# Patient Record
Sex: Male | Born: 1975 | Hispanic: No | Marital: Married | Smoking: Never smoker
Health system: Southern US, Community
[De-identification: ages and names within clinical notes are randomized; demographics above are authoritative.]

---

## 2018-04-02 ENCOUNTER — Encounter (HOSPITAL_COMMUNITY): Payer: Self-pay | Admitting: Emergency Medicine

## 2018-04-02 ENCOUNTER — Other Ambulatory Visit: Payer: Self-pay

## 2018-04-02 ENCOUNTER — Ambulatory Visit (INDEPENDENT_AMBULATORY_CARE_PROVIDER_SITE_OTHER): Payer: Self-pay

## 2018-04-02 ENCOUNTER — Ambulatory Visit (HOSPITAL_COMMUNITY)
Admission: EM | Admit: 2018-04-02 | Discharge: 2018-04-02 | Disposition: A | Discharge status: Home / Self Care | Payer: Self-pay | Attending: Family Medicine | Admitting: Family Medicine

## 2018-04-02 DIAGNOSIS — R0781 Pleurodynia: Secondary | ICD-10-CM

## 2018-04-02 MED ORDER — ALBUTEROL SULFATE HFA 108 (90 BASE) MCG/ACT IN AERS
2.0000 | INHALATION_SPRAY | Freq: Once | RESPIRATORY_TRACT | Status: AC
  Administered 2018-04-02: 2 via RESPIRATORY_TRACT

## 2018-04-02 MED ORDER — ALBUTEROL SULFATE HFA 108 (90 BASE) MCG/ACT IN AERS
INHALATION_SPRAY | RESPIRATORY_TRACT | Status: AC
  Filled 2018-04-02: qty 6.7

## 2018-04-02 MED ORDER — PREDNISONE 20 MG PO TABS: 40.0000 mg | ORAL_TABLET | Freq: Every day | ORAL | 0 refills | Status: AC

## 2018-04-02 NOTE — ED Provider Notes (Signed)
[facility]    CSN: [id number] Arrival date & time: 04/02/18  1207     History   Chief Complaint Chief Complaint  Patient presents with  . Pleurisy    HPI Austin Ferguson is a 42 y.o. male.   Austin Ferguson presents with complaints of chest tightness, worse with deep breathing, which has been experiencing for the past week. He states if he takes multiple deep breaths he feels lightheaded. Denies shortness of breath. Denies any recent travel, any previous similar, fever, cough, leg pain or swelling, pain at rest. No cardiac history, no family cardiac history. States had asthma when he was a teenager but has not had to use an inhaler for approximately 20 years. He works in [employer], states he has been working around dust. Does not smoke.    ROS per HPI.      History reviewed. No pertinent past medical history.  There are no active problems to display for this patient.   History reviewed. No pertinent surgical history.     Home Medications    Prior to Admission medications   Medication Sig Start Date End Date Taking? Authorizing Provider  predniSONE (DELTASONE) 20 MG tablet Take 2 tablets (40 mg total) by mouth daily with breakfast for 5 days. 04/02/18 04/07/18  Georgetta Haber, NP    Family History History reviewed. No pertinent family history.  Social History Social History   Tobacco Use  . Smoking status: Never Smoker  . Smokeless tobacco: Never Used  Substance Use Topics  . Alcohol use: Yes  . Drug use: Never     Allergies   Patient has no known allergies.   Review of Systems Review of Systems   Physical Exam Triage Vital Signs ED Triage Vitals [04/02/18 1310]  Enc Vitals Group     BP 130/73     Pulse Rate 65     Resp 18     Temp 97.6 F (36.4 C)     Temp Source Oral     SpO2 99 %     Weight      Height      Head Circumference      Peak Flow      Pain Score 0     Pain Loc      Pain Edu?      Excl. in GC?    No data  found.  Updated Vital Signs BP 130/73 (BP Location: Left Arm)   Pulse 65   Temp 97.6 F (36.4 C) (Oral)   Resp 18   SpO2 99%    Physical Exam  Constitutional: He is oriented to person, place, and time. He appears well-developed and well-nourished.  HENT:  Head: Normocephalic.  Eyes: Pupils are equal, round, and reactive to light. EOM are normal.  Cardiovascular: Normal rate and regular rhythm.  Pulmonary/Chest: Effort normal and breath sounds normal. No respiratory distress.  Neurological: He is alert and oriented to person, place, and time.  Skin: Skin is warm and dry.   EKG NSR rate 68, without acute findings.   UC Treatments / Results  Labs (all labs ordered are listed, but only abnormal results are displayed) Labs Reviewed - No data to display  EKG None  Radiology Dg Chest 2 View  Result Date: 04/02/2018 CLINICAL DATA:  Chest tightness EXAM: CHEST - 2 VIEW COMPARISON:  None. FINDINGS: Normal heart size. Normal mediastinal contour. No pneumothorax. No pleural effusion. Lungs appear clear, with no acute consolidative airspace disease and no pulmonary  edema. IMPRESSION: No active cardiopulmonary disease. Electronically Signed   By: Delbert Phenix M.D.   On: 04/02/2018 13:37    Procedures Procedures (including critical care time)  Medications Ordered in UC Medications  albuterol (PROVENTIL HFA;VENTOLIN HFA) 108 (90 Base) MCG/ACT inhaler 2 puff (has no administration in time range)    Initial Impression / Assessment and Plan / UC Course  I have reviewed the triage vital signs and the nursing notes.  Pertinent labs & imaging results that were available during my care of the patient were reviewed by me and considered in my medical decision making (see chart for details).     Afebrile. Non toxic in appearance. Without increased work of breathing, tachycardia, tachypnea, hypoxia. ekg and chest xray reassuring today. Dust exposure at work in [employer], hx of asthma in  childhood. Will treat with prednisone and inhaler at this time. Return precautions provided. Encouraged follow up for recheck with PCP in the next 1-2 weeks especially if symptoms persist. Patient verbalized understanding and agreeable to plan.     Final Clinical Impressions(s) / UC Diagnoses   Final diagnoses:  Pleuritic chest pain     Discharge Instructions     Your ekg and chest xray are normal today which is reassuring. We will try to treat this as an inflammatory process with 5 days of prednisone and use of an inhaler every 4-6 hours as needed for tightness, wheezing or shortness of breath. If you develop increased pain, fever, shortness of breath, chest pain, nausea, sweating, jaw or arm pain, or otherwise worsening please return or go to Er .    ED Prescriptions    Medication Sig Dispense Auth. Provider   predniSONE (DELTASONE) 20 MG tablet Take 2 tablets (40 mg total) by mouth daily with breakfast for 5 days. 10 tablet Georgetta Haber, NP     Controlled Substance Prescriptions [place] Controlled Substance Registry consulted? Not Applicable   Georgetta Haber, NP 04/02/18 1354

## 2018-04-02 NOTE — ED Triage Notes (Signed)
The patient presented to the [facility] with a complaint of chest soreness and some tightness when taking a deep breath. The patient reported that he works in [employer] and felt it may be related to inhaling some dust.

## 2018-04-02 NOTE — Discharge Instructions (Signed)
Your ekg and chest xray are normal today which is reassuring. We will try to treat this as an inflammatory process with 5 days of prednisone and use of an inhaler every 4-6 hours as needed for tightness, wheezing or shortness of breath. If you develop increased pain, fever, shortness of breath, chest pain, nausea, sweating, jaw or arm pain, or otherwise worsening please return or go to Er .

## 2019-08-19 IMAGING — DX DG CHEST 2V
2 series · 2 of 2 positions shown · non-contrast
Comparison: None.

CLINICAL DATA: Chest tightness

EXAM:
CHEST - 2 VIEW

[chest pa]
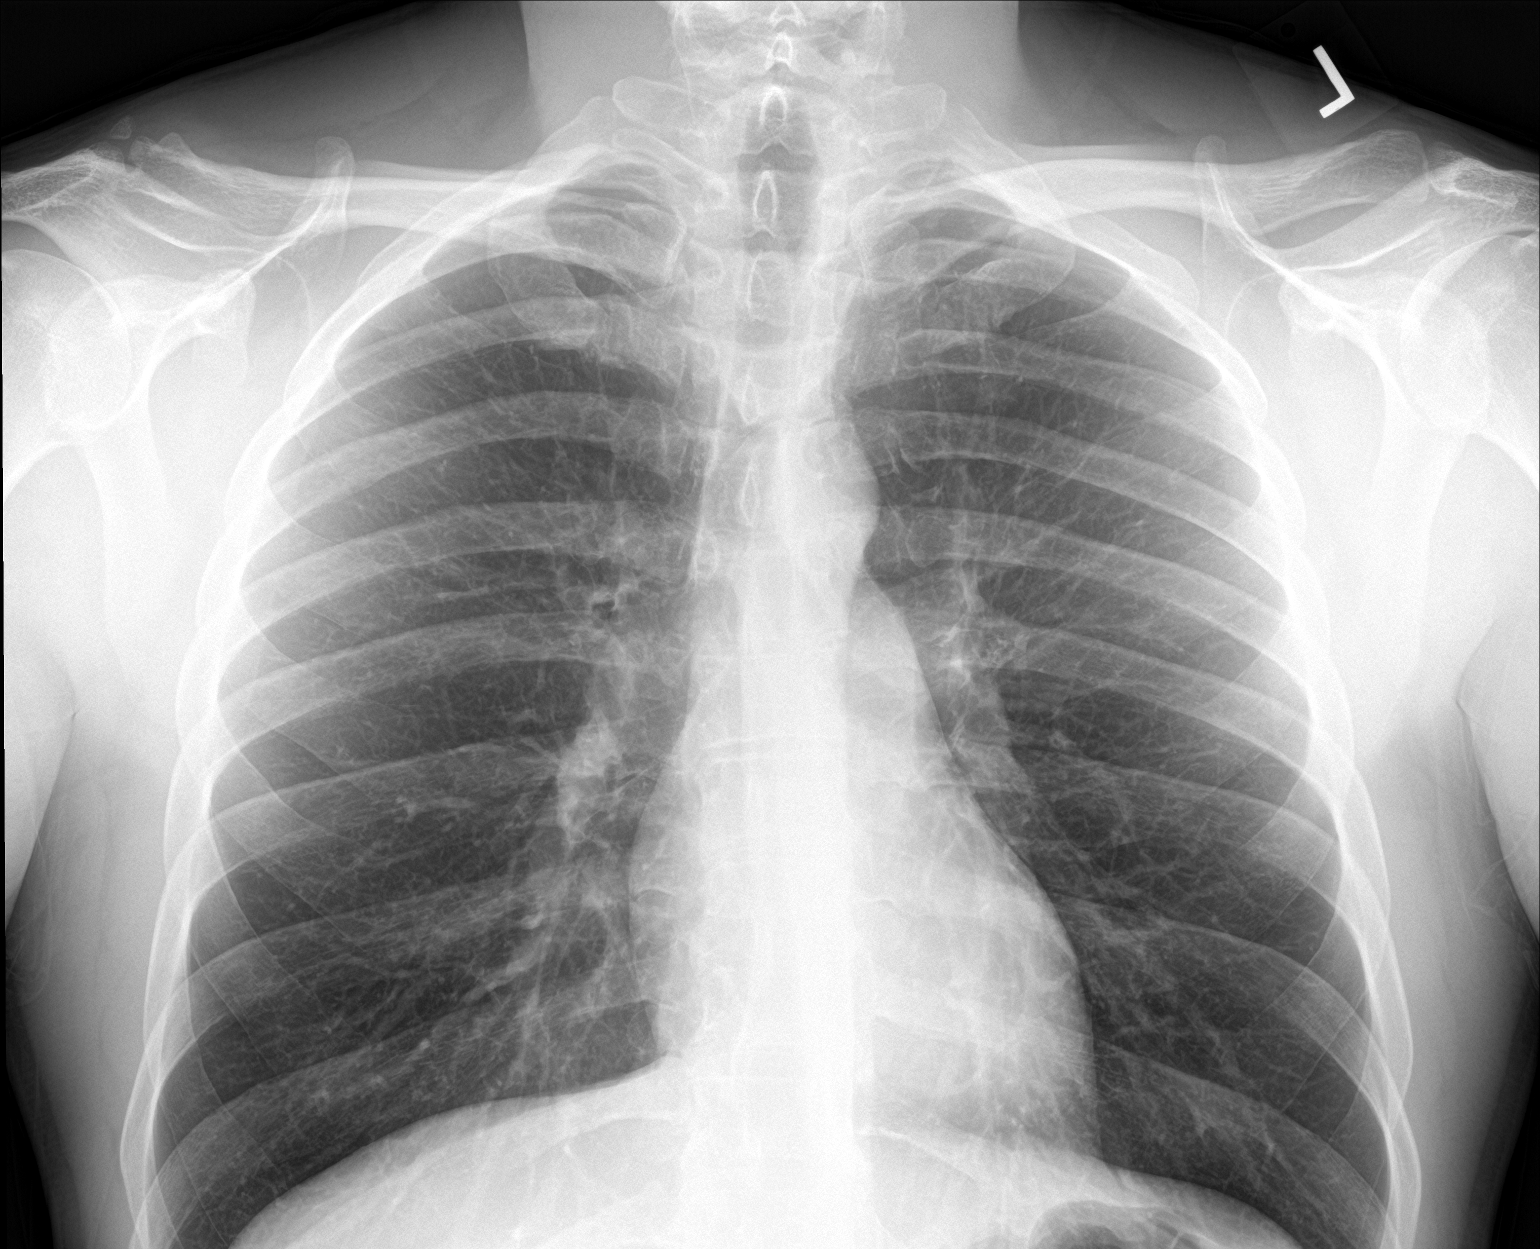

[chest lat]
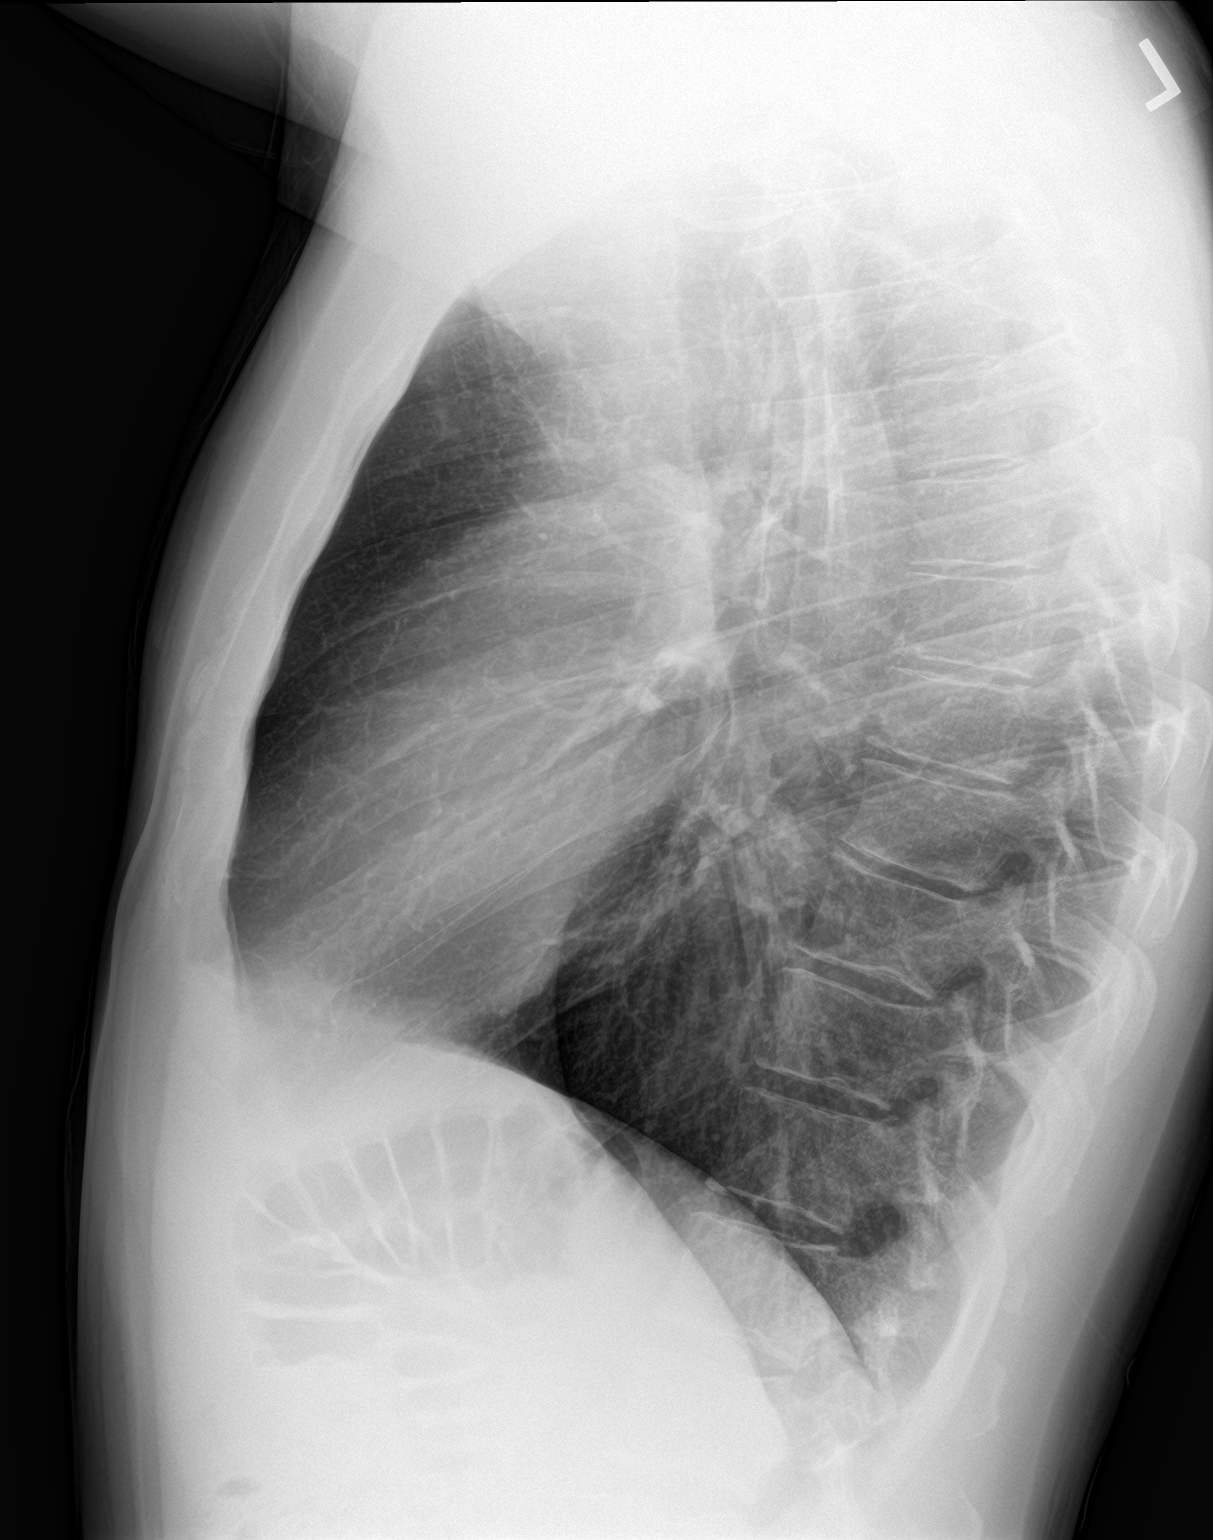

[2 of 2 positions shown; findings below may reference images not displayed]

FINDINGS: Normal heart size. Normal mediastinal contour. No pneumothorax. No
pleural effusion. Lungs appear clear, with no acute consolidative
airspace disease and no pulmonary edema.
IMPRESSION: No active cardiopulmonary disease.
